# Patient Record
Sex: Female | Born: 1960 | Race: Black or African American | Hispanic: No | Marital: Married
Health system: Southern US, Community
[De-identification: ages and names within clinical notes are randomized; demographics above are authoritative.]

## PROBLEM LIST (undated history)

## (undated) DIAGNOSIS — I10 Essential (primary) hypertension: Secondary | ICD-10-CM

---

## 2019-06-18 ENCOUNTER — Emergency Department
Admission: EM | Admit: 2019-06-18 | Discharge: 2019-06-18 | Disposition: A | Discharge status: Home / Self Care | Payer: BLUE CROSS/BLUE SHIELD | Attending: Emergency Medicine | Admitting: Emergency Medicine

## 2019-06-18 DIAGNOSIS — R4182 Altered mental status, unspecified: Secondary | ICD-10-CM | POA: Insufficient documentation

## 2019-06-18 DIAGNOSIS — I10 Essential (primary) hypertension: Secondary | ICD-10-CM | POA: Insufficient documentation

## 2019-06-18 DIAGNOSIS — M549 Dorsalgia, unspecified: Secondary | ICD-10-CM | POA: Insufficient documentation

## 2019-06-18 DIAGNOSIS — T407X1A Poisoning by cannabis (derivatives), accidental (unintentional), initial encounter: Secondary | ICD-10-CM | POA: Insufficient documentation

## 2019-06-18 DIAGNOSIS — G8929 Other chronic pain: Secondary | ICD-10-CM | POA: Insufficient documentation

## 2019-06-18 DIAGNOSIS — F19920 Other psychoactive substance use, unspecified with intoxication, uncomplicated: Secondary | ICD-10-CM

## 2019-06-18 MED ORDER — LACTATED RINGERS IV SOLN
Freq: Once | INTRAVENOUS | Status: AC
Start: 2019-06-18 — End: 2019-06-18
  Administered 2019-06-18: 17:00:00 via INTRAVENOUS

## 2019-06-18 NOTE — ED Notes (Signed)
Assumed care of pt, received pt somewhat drowsy, vitals updated and noted.  Side rails up x 2 for pt. Safety.

## 2019-06-18 NOTE — ED Notes (Signed)
Bed: T02  Expected date:   Expected time:   Means of arrival:   Comments:  Medic aloc

## 2019-06-18 NOTE — ED Provider Notes (Signed)
Emergency Department Note  [facility] electronic medical record reviewed for pertinent medical history.     Patient: Hannah Navarro, MRN [id number], DOB 12-Feb-1961  The Date of Service for the Emergency Room encounter is 06/18/2019  4:37 PM     Nursing Triage Note :   Chief Complaint   Patient presents with   . Overdose- Accidental     pt  drank a THC drink and consumed a large amount of a THC Gummy; ST 104; BP 170/98 Temp 98.9 RR 16; sugar 114; [place]; 22 gauge in left hand; 4 mg zofran adminsitered       HPI :   Hannah Navarro is a 58 year old female with PMHx as below notable for HTN and chronic back pain who presents with AMS.    The history is limited, as the patient is unable to fully provide a history due to AMS. Per medics, Patient is visiting from out of town and endorses drinking EtOH and eating THC edible gummy prior to ED arrival. Her friend called 911 due to her AMS, she was found to be diaphoretic and nauseous in the field. Received zofran, 2 episodes of nonbloody emesis. Endorses taking hydrocodone today for her back pain but denies ingestion of other substances or IVDU.    No evidence of acute trauma at the scene and patient without any external signs of head/neck/facial trauma.     ROS: Limited due to AMS, patient denies F/ cough/ SOB/ CP/ HA/ lightheadedness/ N/ V/ D/ abdominal pain.     Denies neck pain or stiffness/ photophobia/ blurry vision/ double vision/ pain with eye movement/ weakness/ numbness/ tingling/ balance or walking issues/ dysuria/ hematuria/ CP/ palpitations/ LE swelling/     Past Medical History : HTN    Past Surgical history : No past surgical history on file.    Family History: No family history on file.    Social History:  Social History     Tobacco Use   . Smoking status: Not on file   Substance Use Topics   . Alcohol use: Not on file   . Drug use: Not on file     Housed    Medications:   None       Allergies: Patient has no known  allergies.    Review of Systems:   Complete review of 12 systems reviewed and negative unless otherwise noted in the HPI or above. This was done per my custom and practice for systems appropriate to the chief complaint in an emergency department setting and varies depending on the quality of history that the patient is able to provide. History reviewed today as available from records and EPIC chart.      Physical Exam:   06/18/19  1639   BP: 133/57   Pulse: 53   Resp: 18   Temp: 98.7 F (37.1 C)   SpO2: 94%       Nursing note and vitals reviewed. Pt with no hypoxia.  Gen: Patient is in NAD, non-toxic appearing,  Drowsy but arousable. A/Ox3  HEENT: NC/AT, PERRL, EOMI, MMM, no conjunctival injection, b/l sclera anicteric, oropharynx clear, no LAD, no cephalohematomas, no facial deformities or signs of trauma  Neck: Supple. No midline tenderness. No meningeal signs.  Cardiovascular: RRR no m/r/g, normal heart sounds, no JVD, no carotid bruits  Pulmonary/Chest: CTAB, no increased WOB, no respiratory distress, no wheezes/rhonchi/rales  Abdominal: Soft. NT/ND, +BS, no r/g, no masses   Extr/MSK: Well  perfused, distal pulses intact. No edema. No tenderness. FROM.  Back: No CVAT   Neuro: Limited due to patient cooperation, no facial droop, speech slurred. Sensation intact to light touch in b/l upper/lower extremity   Psychiatric: Normal affect. Mood not labile nor depressed.   Skin: No rashes, lesions, or wounds.     ED Course & Clinical Decision Making:  58 year old  female with PMHx as listed in HPI presents with AMS.       DDx broad including accidental intoxication/overdose; The patient's prehospital fingerstick glucose level was 114, making hypoglycemia an unlikely etiology of this alteration in mental status.  History and physical with low clinical suspicion to strongly suggest life threatening or emergent etiology of AMS, such as ongoing hypotension, hypoxia, respiratory failure, acute CVA, severe electrolyte  abnormality, acute renal failure/uremia, hypertensive or hepatic encephalopathy, stroke, meningitis, encephalitis.    Initial workup broad at this point to include basic labs, EKG, CXR, and UA. Systemic disseminated bacterial infection leading to sepsis is lower on differential, therefore will not activate code sepsis until initial labs return as still unclear if infectious etiology for AMS. Patient placed on cardiac monitoring and pulse oximetry. As cannot r/o acute drug intoxication, will closely monitor as she metabolizes, and re-evaluate for any obvious signs of additional pathology once mentation improves for more reliable history.      Inconsistent with a CNS issue as neuro exam nonfocal, no central vertigo, no signs such as diplopia or dysarthria, and no signs of cranial nerve involvement. No significant external signs of head trauma, therefore will defer advanced neuroimaging at this time, however will consider after initial labs and imaging. Will also provide patient with IVF, high dose PO thiamine and folic acid MVI.    Will continue to reassess patient while in the ER and adjust workup as needed. Symptomatic control as below. Disposition pending workup.     No orders of the defined types were placed in this encounter.    Medications   lactated ringers 1,000 mL IV bolus ( IntraVENOUS New Bag 06/18/19 1710)         Workup Review as of Jun 17 2213   Hannah FinlaySpann, Hannah Navarro Documentation   Fri Jun 18, 2019   1826 Patient re-evaluated, is less drowsy and more alert. Patient observed talking on her cell phone. Will consider ambulation trial at this time          ED Course    Patient is able to tolerate PO intake and ambulate independently    I reevaluated the patient. The patient was discharged home, status improved, with instructions regarding supportive care, medications, and appropriate follow up. I discussed indications for seeking immediate medical attention, and the patient voiced understanding. The  patient was discharged home in stable condition after all questions were answered.       I have discussed my thought process and plan for the patient with the attending physician, Daphene JaegerSloane, Christian M, MD.    Hannah Finlayachel Britton Perkinson, MD  Emergency Medicine Resident, PGY1  [facility]       Llewellyn Schoenberger, Charmaine Downsachel Tiptanya, MD  Resident  06/18/19 2215       Daphene JaegerSloane, Christian M, MD  06/18/19 [phone]

## 2019-06-18 NOTE — EMS Narrative (Addendum)
Pt Age: 58 Years; Gender: Female;  Primary Impression: Behavioral/Psychiatric Crisis;  Medical History: Hypertension (HTN); Pt Medication: ; Pt Allergies: ;    CC:  Date/Time of Symptom Onset: 2020-09-18T15:37:54.000-07:00    HPI:  Provider's Primary Impression: Mental disorder, not otherwise specified  Initial Patient Acuity: Lower Acuity Nyoka Cowden)    Alert:  Patient Care Report Number: [id number]  Incident Number: [id number]  EMS Vehicle (Unit) Number: [place]  EMS Unit Call Sign: M3  Level of Care of This Unit: ALS-Paramedic  Incident Street Address: [place]: [id number]  Incident ZIP Code: [phone]    Assessment:    Procedure - Arrest:  Cardiac Arrest: No    Procedure - Exam:    Procedure - Injury:    Procedure - Airway:    Procedure - Medications:    Procedure - Generic:    Demographics History:  Alcohol/Drug Use Indicators: Smell of Alcohol on Breath    Demographics Practitioner:    Demographics Patient:  Last Name: Worthey  First Name: [place]: [id number]  Patient's Home State: [place] of Residence: [place]  Gender: Female  Age: 61  Age Units: Years    Demographics Times:  Unit Notified by Dispatch Date/Time: 2020-09-18T15:26:59.000-07:00  Unit En Route Date/Time: 2020-09-18T15:27:45.000-07:00  Unit Arrived on Scene Date/Time: 2020-09-18T15:33:13.000-07:00  Arrived at Patient Date/Time: 2020-09-18T15:37:59.000-07:00  Unit Left Scene Date/Time: 2020-09-18T15:50:01.000-07:00  Patient Arrived at Destination Date/Time: 2020-09-18T16:00:02.000-07:00    Demographics Payment:

## 2019-06-18 NOTE — Discharge Instructions (Signed)
Please return to the Emergency Department if you experience chest pain, shortness of breath, fever, chills, or you have any other concerns.

## 2019-06-18 NOTE — ED Notes (Signed)
PIV removed, catheter tip intact. VSS. AVS provided to pt.  Pt denied further questions/needs/concerns. Ambulated with steady gait. Husband driving pt home.

## 2019-06-18 NOTE — ED Notes (Signed)
Pt tolerated PO juice & ambulated to restroom with steady gait. Pt stated she feels safe to go home with husband

## 2019-06-18 NOTE — ED Notes (Signed)
breathalizer with mod effort 0.000

## 2019-12-30 ENCOUNTER — Encounter (INDEPENDENT_AMBULATORY_CARE_PROVIDER_SITE_OTHER): Payer: Self-pay

## 2021-03-04 ENCOUNTER — Encounter (HOSPITAL_COMMUNITY): Payer: Self-pay

## 2021-03-04 ENCOUNTER — Other Ambulatory Visit: Payer: Self-pay

## 2021-03-04 ENCOUNTER — Emergency Department (HOSPITAL_COMMUNITY): Payer: Self-pay

## 2021-03-04 ENCOUNTER — Emergency Department (HOSPITAL_COMMUNITY)
Admission: EM | Admit: 2021-03-04 | Discharge: 2021-03-04 | Disposition: A | Discharge status: Home / Self Care | Payer: Self-pay | Attending: Emergency Medicine | Admitting: Emergency Medicine

## 2021-03-04 DIAGNOSIS — R079 Chest pain, unspecified: Secondary | ICD-10-CM | POA: Insufficient documentation

## 2021-03-04 DIAGNOSIS — I10 Essential (primary) hypertension: Secondary | ICD-10-CM | POA: Insufficient documentation

## 2021-03-04 DIAGNOSIS — R1013 Epigastric pain: Secondary | ICD-10-CM | POA: Insufficient documentation

## 2021-03-04 HISTORY — DX: Essential (primary) hypertension: I10

## 2021-03-04 LAB — BASIC METABOLIC PANEL
Anion gap: 9 (ref 5–15)
BUN: 12 mg/dL (ref 6–20)
CO2: 27 mmol/L (ref 22–32)
Calcium: 9.1 mg/dL (ref 8.9–10.3)
Chloride: 104 mmol/L (ref 98–111)
Creatinine, Ser: 0.81 mg/dL (ref 0.44–1.00)
GFR, Estimated: >60 mL/min (ref >60)
Glucose, Bld: 113 mg/dL — ABNORMAL HIGH (ref 70–99)
Potassium: 3.4 mmol/L — ABNORMAL LOW (ref 3.5–5.1)
Sodium: 140 mmol/L (ref 135–145)

## 2021-03-04 LAB — CBC
HCT: 31.4 % — ABNORMAL LOW (ref 36.0–46.0)
Hemoglobin: 9.1 g/dL — ABNORMAL LOW (ref 12.0–15.0)
MCH: 22.5 pg — ABNORMAL LOW (ref 26.0–34.0)
MCHC: 29 g/dL — ABNORMAL LOW (ref 30.0–36.0)
MCV: 77.7 fL — ABNORMAL LOW (ref 80.0–100.0)
Platelets: 306 10*3/uL (ref 150–400)
RBC: 4.04 MIL/uL (ref 3.87–5.11)
RDW: 17.9 % — ABNORMAL HIGH (ref 11.5–15.5)
WBC: 7.1 10*3/uL (ref 4.0–10.5)
nRBC: 0 % (ref 0.0–0.2)

## 2021-03-04 LAB — HEPATIC FUNCTION PANEL
ALT: 19 U/L (ref 0–44)
AST: 19 U/L (ref 15–41)
Albumin: 3.5 g/dL (ref 3.5–5.0)
Alkaline Phosphatase: 79 U/L (ref 38–126)
Bilirubin, Direct: <0.1 mg/dL (ref 0.0–0.2)
Total Bilirubin: 0.3 mg/dL (ref 0.3–1.2)
Total Protein: 6.8 g/dL (ref 6.5–8.1)

## 2021-03-04 LAB — I-STAT BETA HCG BLOOD, ED (MC, WL, AP ONLY): I-stat hCG, quantitative: <5 m[IU]/mL (ref <5)

## 2021-03-04 LAB — TROPONIN I (HIGH SENSITIVITY)
Troponin I (High Sensitivity): 8 ng/L (ref <18)
Troponin I (High Sensitivity): 7 ng/L (ref <18)

## 2021-03-04 LAB — LIPASE, BLOOD: Lipase: 35 U/L (ref 11–51)

## 2021-03-04 MED ORDER — PANTOPRAZOLE SODIUM 40 MG PO TBEC: 40.0000 mg | DELAYED_RELEASE_TABLET | Freq: Every day | ORAL | 0 refills | Status: AC

## 2021-03-04 MED ORDER — MORPHINE SULFATE (PF) 4 MG/ML IV SOLN
4.0000 mg | Freq: Once | INTRAVENOUS | Status: AC
  Administered 2021-03-04: 4 mg via INTRAVENOUS
  Filled 2021-03-04: qty 1

## 2021-03-04 MED ORDER — ONDANSETRON HCL 4 MG/2ML IJ SOLN
4.0000 mg | Freq: Once | INTRAMUSCULAR | Status: AC
  Administered 2021-03-04: 4 mg via INTRAVENOUS
  Filled 2021-03-04: qty 2

## 2021-03-04 MED ORDER — SODIUM CHLORIDE 0.9 % IV BOLUS
1000.0000 mL | Freq: Once | INTRAVENOUS | Status: AC
  Administered 2021-03-04: 1000 mL via INTRAVENOUS

## 2021-03-04 NOTE — ED Notes (Signed)
Receiving abdominal ultrasound in ED 21

## 2021-03-04 NOTE — ED Notes (Signed)
Iv team at the bedside 

## 2021-03-04 NOTE — ED Triage Notes (Signed)
Patient complains of intermittent chest pain since Friday. Right anterior chest pain and pain worse with movement and inspiration. Denies any respiratory illness. Patient states tender to touch

## 2021-03-04 NOTE — ED Provider Notes (Signed)
[facility] EMERGENCY DEPARTMENT Provider Note   CSN: [id number] Arrival date & time: 03/04/21  1024     History No chief complaint on file.   Diana Lynch is a 60 y.o. female.  60 year old female with past medical history of hypertension presents with complaint of epigastric pain described as a heaviness, intermittent for the past 3 weeks, worse for the past week.  Pain is worse with deep inspiration or with palpation.  Denies associated nausea, vomiting, shortness of breath, diaphoresis.  Denies changes in bowel or bladder habits.  Patient was seen on May 16 at an ER in [place] for same, scheduled for outpatient stress test and told she was anemic, has been taking iron supplement.        Past Medical History:  Diagnosis Date  . Hypertension     There are no problems to display for this patient.   History reviewed. No pertinent surgical history.   OB History   No obstetric history on file.     No family history on file.     Home Medications Prior to Admission medications   Not on File    Allergies    Sulfa antibiotics and Tramadol  Review of Systems   Review of Systems  Constitutional: Negative for chills and fever.  Respiratory: Negative for shortness of breath.   Cardiovascular: Positive for chest pain.  Gastrointestinal: Positive for abdominal pain. Negative for constipation, diarrhea, nausea and vomiting.  Musculoskeletal: Negative for back pain.  Skin: Negative for rash and wound.  Allergic/Immunologic: Negative for immunocompromised state.  Neurological: Negative for weakness.  Psychiatric/Behavioral: Negative for confusion.  All other systems reviewed and are negative.   Physical Exam Updated Vital Signs BP (!) 168/84   Pulse 70   Temp 98.9 F (37.2 C) (Oral)   Resp 20   SpO2 100%   Physical Exam Vitals and nursing note reviewed.  Constitutional:      General: She is not in acute distress.    Appearance: She is  well-developed. She is not diaphoretic.  HENT:     Head: Normocephalic and atraumatic.  Eyes:     Conjunctiva/sclera: Conjunctivae normal.  Cardiovascular:     Rate and Rhythm: Normal rate and regular rhythm.     Pulses: Normal pulses.     Heart sounds: Normal heart sounds.  Pulmonary:     Effort: Pulmonary effort is normal.     Breath sounds: Normal breath sounds.  Abdominal:     Palpations: Abdomen is soft.     Tenderness: There is abdominal tenderness in the right upper quadrant and epigastric area.  Musculoskeletal:     Cervical back: Neck supple.  Skin:    General: Skin is warm and dry.     Findings: No erythema or rash.  Neurological:     Mental Status: She is alert and oriented to person, place, and time.  Psychiatric:        Behavior: Behavior normal.     ED Results / Procedures / Treatments   Labs (all labs ordered are listed, but only abnormal results are displayed) Labs Reviewed  BASIC METABOLIC PANEL - Abnormal; Notable for the following components:      Result Value   Potassium 3.4 (*)    Glucose, Bld 113 (*)    All other components within normal limits  CBC - Abnormal; Notable for the following components:   Hemoglobin 9.1 (*)    HCT 31.4 (*)    MCV 77.7 (*)  MCH 22.5 (*)    MCHC 29.0 (*)    RDW 17.9 (*)    All other components within normal limits  HEPATIC FUNCTION PANEL  LIPASE, BLOOD  I-STAT BETA HCG BLOOD, ED ([facility], [facility], [facility] ONLY)  TROPONIN I (HIGH SENSITIVITY)  TROPONIN I (HIGH SENSITIVITY)    EKG None  Radiology DG Chest 2 View  Result Date: 03/04/2021 CLINICAL DATA:  Right-sided chest pain. EXAM: CHEST - 2 VIEW COMPARISON:  None. FINDINGS: The heart size and mediastinal contours are within normal limits. Both lungs are clear. The visualized skeletal structures are unremarkable. IMPRESSION: No active cardiopulmonary disease. Electronically Signed   By: Zerita Boers M.D.   On: 03/04/2021 11:10   US Abdomen Limited  Result Date:  03/04/2021 CLINICAL DATA:  Three weeks of right upper quadrant pain EXAM: ULTRASOUND ABDOMEN LIMITED RIGHT UPPER QUADRANT COMPARISON:  None. FINDINGS: Gallbladder: No gallstones or wall thickening visualized. No sonographic Angeli Demilio sign noted by sonographer. Common bile duct: Diameter: 6 mm Liver: No focal lesion identified. Within normal limits in parenchymal echogenicity. Portal vein is patent on color Doppler imaging with normal direction of blood flow towards the liver. Other: Multiple right renal cysts measuring up to 2.6 cm. IMPRESSION: Unremarkable ultrasound of the right upper quadrant. Electronically Signed   By: Dahlia Bailiff MD   On: 03/04/2021 14:32    Procedures Procedures   Medications Ordered in ED Medications  ondansetron [facility]) injection 4 mg (has no administration in time range)  morphine 4 MG/ML injection 4 mg (has no administration in time range)  sodium chloride 0.9 % bolus 1,000 mL (has no administration in time range)    ED Course  I have reviewed the triage vital signs and the nursing notes.  Pertinent labs & imaging results that were available during my care of the patient were reviewed by me and considered in my medical decision making (see chart for details).  Clinical Course as of 03/04/21 1516  Sun Mar 05, 2563  5724 60 year old female with complaint of epigastric/lower chest pain for the past 3 weeks, progressively worsening.  Found to have epigastric and right upper quadrant tenderness on exam. Cardiac work-up in the ED in [place] on May 16 without significant findings and referred for stress test which is scheduled at the end of this month however patient is scheduled to travel to [place] before this test can be completed and she feels that she is not well enough to travel at this point.  Ultrasound is negative for gallbladder disease.  CBC with hemoglobin of 9.1, previously upper eights on her last ED work-up, is taking supplemental iron and has been  referred for follow-up, no significant change from prior to suggest active GI bleed. BMP without significant findings, hepatic function pending given complaint of right upper quadrant pain as well as pending lipase.  Initial troponin is 8, repeat pending.  Care signed out pending hepatic function panel and lipase, repeat troponin. [LM]  1513 Atypical CP (eigastric/RUQ) x 3 weeks Has had ACS w/u at other hospital recently. Was told she would need a stress test but not scheduled until a few weeks.  RUQ [place] negative.  Follow up hepatic fxn panel, lipase, repeat trop (although delta compared to recent ED).  [ZB]    Clinical Course User Index [LM] Tacy Learn, PA-C [ZB] Pearson Grippe, DO   MDM Rules/Calculators/A&P  Final Clinical Impression(s) / ED Diagnoses Final diagnoses:  Epigastric pain    Rx / DC Orders ED Discharge Orders    None       Tacy Learn, PA-C 03/04/21 1516    Varney Biles, MD 03/04/21 [phone]

## 2021-03-04 NOTE — Discharge Instructions (Signed)
Based on the blood work that we did today, you are not having a heart attack.  The only way to know if you have heart disease is with a stress test just like was recommended by the emergency department in [place]. We also do testing of your pancreas, gallbladder and that was all normal.  It is possible that you have gastritis also known as GERD/esophageal reflux.  Recommend taking the Protonix that was previously prescribed to see if you have any relief. Please attend the appointment for your previously scheduled stress test.

## 2021-03-04 NOTE — ED Notes (Signed)
IV attempt to RAC not successful

## 2021-03-04 NOTE — ED Provider Notes (Signed)
[facility] Physical Exam  BP (!) 168/92   Pulse 70   Temp 98.9 F (37.2 C) (Oral)   Resp 14   SpO2 100%     ED Course/Procedures   Clinical Course as of 03/04/21 1638  Sun Mar 04, 5968  4023 60 year old female with complaint of epigastric/lower chest pain for the past 3 weeks, progressively worsening.  Found to have epigastric and right upper quadrant tenderness on exam. Cardiac work-up in the ED in [place] on May 16 without significant findings and referred for stress test which is scheduled at the end of this month however patient is scheduled to travel to [place] before this test can be completed and she feels that she is not well enough to travel at this point.  Ultrasound is negative for gallbladder disease.  CBC with hemoglobin of 9.1, previously upper eights on her last ED work-up, is taking supplemental iron and has been referred for follow-up, no significant change from prior to suggest active GI bleed. BMP without significant findings, hepatic function pending given complaint of right upper quadrant pain as well as pending lipase.  Initial troponin is 8, repeat pending.  Care signed out pending hepatic function panel and lipase, repeat troponin. [LM]  1513 Atypical CP (eigastric/RUQ) x 3 weeks Has had ACS w/u at other hospital recently. Was told she would need a stress test but not scheduled until a few weeks.  RUQ [place] negative.  Follow up hepatic fxn panel, lipase, repeat trop (although delta compared to recent ED).  [ZB]    Clinical Course User Index [LM] Tacy Learn, PA-C [ZB] Pearson Grippe, DO    Procedures  MDM    Agree with previous team assessment and plan. Delta troponin negative. Low risk via heart pathway.  She has been previously scheduled for a stress test and will follow up with that as scheduled. Likely suffering from GERD.  We will start her back on previously prescribed Protonix. Discharged in good condition.       Pearson Grippe, DO 03/04/21 1639    Quintella Reichert, MD 03/04/21 [phone]

## 2021-07-04 ENCOUNTER — Encounter: Payer: Self-pay | Admitting: *Deleted

## 2021-07-06 ENCOUNTER — Inpatient Hospital Stay: Payer: BC Managed Care – PPO | Attending: Oncology | Admitting: Oncology

## 2021-07-06 ENCOUNTER — Other Ambulatory Visit: Payer: Self-pay

## 2021-07-06 ENCOUNTER — Encounter: Payer: Self-pay | Admitting: *Deleted

## 2021-07-06 VITALS — BP 174/99 | HR 83 | Temp 98.7°F | Resp 18 | Ht 69.0 in | Wt 250.0 lb

## 2021-07-06 DIAGNOSIS — C50111 Malignant neoplasm of central portion of right female breast: Secondary | ICD-10-CM | POA: Diagnosis not present

## 2021-07-06 DIAGNOSIS — C50919 Malignant neoplasm of unspecified site of unspecified female breast: Secondary | ICD-10-CM | POA: Insufficient documentation

## 2021-07-06 DIAGNOSIS — Z170 Estrogen receptor positive status [ER+]: Secondary | ICD-10-CM | POA: Diagnosis not present

## 2021-07-06 NOTE — Progress Notes (Signed)
PATIENT NAVIGATOR PROGRESS NOTE  Name: Diana Lynch Date: 07/06/2021 MRN: [id number]  DOB: 16-Mar-1961   Reason for visit:  Initial visit with Dr Benay Spice  Comments:  Met with patient and her husband Marcue during initial visit with Dr Benay Spice, she is having Bilateral mastectomies on 10/19, Dr Benay Spice will see her in clinic on 10/28 to discuss post surgical pathology and treatment plan. Answered questions and gave her contact information to call with any questions or issue.     Time spent counseling/coordinating care: 30-45 minutes

## 2021-07-06 NOTE — Progress Notes (Signed)
Diana Lynch Consult   Requesting MD: Earlean Polka, Md [address] [place],  [place] [id number]   Diana Lynch 60 y.o.  09-17-1961    Reason for Consult: Breast cancer   HPI: [name] reports seeing her gynecologist for a routine examination.  She was noted to have a mass at the 12 o'clock position of the right breast.  She was referred for a bilateral tomosynthesis mammogram on 05/23/2021.  A new irregular area of architectural distortion with pleomorphic calcifications were noted in the right breast at the 12 o'clock position.  No other masses in either breast.  Comparison was made to a mammogram from 05/02/2020. A right diagnostic mammogram on 05/31/2021 revealed a 2.6 x 3.8 x 3.1 cm irregular density with a spiculated margin at the 12 o'clock position middle depth in the right breast.  No other masses.   A right ultrasound on 05/31/2021 revealed a 1.6 x 2.1 x 2.2 cm mass with irregular margins at the 12 o'clock position of the right breast.  The right axilla appeared "clear ". She was referred for an ultrasound-guided biopsy on 06/14/2021. The pathology revealed invasive ductal carcinoma with the greatest dimension being 12 mm in the core biopsy.  The tumor is grade 2 with a minor component of DCIS.  The tumor is HER2 negative by FISH, ER positive (2+, 60%), PR low positive (2+, 2%).  She was referred to Dr. West Carbo to discuss surgical options.  She has elected to proceed with bilateral mastectomy.  She was referred for genetic counseling and no pathogenic variant was identified.  She is scheduled for bilateral mastectomy and a sentinel lymph node biopsy on 07/18/2021.  She was referred for a plain x-ray of the left hip and pelvis on 06/28/2021.  Mild bilateral hip degenerative changes were noted.  Past Medical History:  Diagnosis Date   Hypertension     .  G2, P2   .  Asthma   .  Chronic back pain   .  History of anemia secondary to  menorrhagia   .  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  Eczema  Past surgical history: Hysterectomy Lumbar disc surgery  Medications: Reviewed  Allergies:  Allergies  Allergen Reactions   Sulfa Antibiotics Rash   Tramadol Itching    Family history: 2 maternal aunts died of breast cancer (age 78 and 74s), 2 maternal aunts died of uterine cancer, 2 maternal cousins had breast cancer, 1 died at an age of less than 67, a maternal uncle had prostate cancer.  A maternal uncle had "cancer ".  Social History:   She lives with her husband in [place].  She works from home for [employer].  She smokes cigarettes when she is "stressed ".  She reports social alcohol use.  No transfusion history.  She has received COVID-19 vaccines  ROS:   Positives include: Nausea for the past year treated with Phenergan, "stress "headaches, pruritus at the neck and arms related to eczema, chronic low back pain, discomfort in the right breast, left lower back pain-worse with weightbearing for the past few months  A complete ROS was otherwise negative.  Physical Exam:  Blood pressure (!) 174/99, pulse 83, temperature 98.7 F (37.1 C), temperature source Oral, resp. rate 18, height '5\' 9"'  (1.753 m), weight 250 lb (113.4 kg), SpO2 100 %.  HEENT: Neck without mass Lungs: Clear bilaterally Cardiac: Regular rate and rhythm Abdomen: No hepatosplenomegaly, no mass, nontender  Vascular: No leg edema Lymph nodes: No cervical, supraclavicular, axillary, or inguinal nodes Neurologic: Alert and oriented, motor exam appears intact in the upper and lower extremities bilaterally Skin: Dry flaking rash at the dorsal lower arms and to a lesser extent at the neck, scars at the low abdominal pannus and bilateral axillae Musculoskeletal: Tender at the posterior iliac bilaterally, no mass Breast: At the upper right breast 12 o'clock position there is an approximate 2 cm mass, the right axilla appears benign.  Left breast  without mass   LAB:  CBC  Lab Results  Component Value Date   WBC 7.1 03/04/2021   HGB 9.1 (L) 03/04/2021   HCT 31.4 (L) 03/04/2021   MCV 77.7 (L) 03/04/2021   PLT 306 03/04/2021        CMP  Lab Results  Component Value Date   NA 140 03/04/2021   K 3.4 (L) 03/04/2021   CL 104 03/04/2021   CO2 27 03/04/2021   GLUCOSE 113 (H) 03/04/2021   BUN 12 03/04/2021   CREATININE 0.81 03/04/2021   CALCIUM 9.1 03/04/2021   PROT 6.8 03/04/2021   ALBUMIN 3.5 03/04/2021   AST 19 03/04/2021   ALT 19 03/04/2021   ALKPHOS 79 03/04/2021   BILITOT 0.3 03/04/2021   GFRNONAA >60 03/04/2021      Imaging: As per HPI, pelvis and left hip x-ray 06/28/2021-no fracture, mild bilateral hip degenerative changes   Assessment/Plan:   Right breast cancer Right breast mass at 12:00 palpated on physical exam by gynecology Tomosynthesis mammogram 05/23/2021-new irregular distortion with pleomorphic calcifications in the right breast at 12:00 anterior depth Diagnostic right mammogram 05/31/2021-2.6 x 3.8 x 3.1 cm irregular density with spiculated margin in the right breast at 12:00 middle depth Right breast ultrasound 05/31/2021 -1.6 x 2.1 x 2.2 cm mass with irregular margins at 12:00 in the right breast, right axilla is "clear " Ultrasound-guided biopsy right breast mass 06/14/2021-invasive ductal carcinoma, HER2 negative by FISH, ER positive (2+, 60%), PR positive (2+, 2%)  G2 P2 Hypertension Eczema Hysterectomy secondary to menorrhagia Multiple family members with breast cancer, family history of uterine cancer and prostate cancer History of large cyst in the inferior right kidney on CT 08/16/2020-follow-up CT recommended   Disposition:   [name] has been diagnosed with invasive breast cancer.  She has a clinical node-negative tumor with low-level hormone receptor expression.  I discussed treatment options with Ms. Ricard Dillon.  We discussed lumpectomy/sentinel lymph node biopsy.  We also discussed  neoadjuvant and adjuvant chemotherapy.  She has made a firm decision to proceed with bilateral mastectomy and a sentinel lymph node biopsy.  I will plan to see her after surgery to discuss adjuvant systemic treatment options.  We will request repeat hormone receptor testing on the resected tumor.  I will confer with Dr. West Carbo regarding the tumor board discussion at [facility] and the surgical plan.  She will return for follow-up visit on 07/19/2021 to review the surgical pathology report and make adjuvant treatment recommendations.  Betsy Coder, MD  07/06/2021, 10:45 AM

## 2021-07-23 ENCOUNTER — Telehealth: Payer: Self-pay | Admitting: *Deleted

## 2021-07-24 ENCOUNTER — Encounter: Payer: Self-pay | Admitting: *Deleted

## 2021-07-24 NOTE — Progress Notes (Signed)
PATIENT NAVIGATOR PROGRESS NOTE  Name: Diana Lynch Date: 07/24/2021 MRN: [id number]  DOB: Dec 04, 1960   Reason for visit:  Phone call after surgery  Comments:  Spoke with patient after surgery on 10/19 bilateral mastectomies. She has F/U appt with surgeon this Thursday with hopes of having drains removed at that time. Will call Thursday afternoon to confirm appt here on Friday 10/28    Time spent counseling/coordinating care: < 15 minutes

## 2021-07-26 ENCOUNTER — Encounter: Payer: Self-pay | Admitting: *Deleted

## 2021-07-27 ENCOUNTER — Inpatient Hospital Stay: Payer: BC Managed Care – PPO | Admitting: Oncology

## 2021-08-14 ENCOUNTER — Encounter: Payer: Self-pay | Admitting: *Deleted

## 2021-08-14 ENCOUNTER — Inpatient Hospital Stay: Payer: BC Managed Care – PPO | Attending: Oncology | Admitting: Oncology

## 2022-05-10 ENCOUNTER — Telehealth: Payer: Self-pay | Admitting: *Deleted

## 2022-05-10 NOTE — Telephone Encounter (Signed)
Patient called to request RN email her new patient note from 06/2021 to her. Informed her that we are not able to email records, but she can sign up for mychart and see the note. Provided website for [employer] and phone # for Help desk. Also provided phone # for billing as she was requesting copy of bill for that encounter.

## 2022-12-11 IMAGING — US US ABDOMEN LIMITED
1 series · 14 of 25 positions shown · non-contrast
Comparison: None.

CLINICAL DATA: Three weeks of right upper quadrant pain

EXAM:
ULTRASOUND ABDOMEN LIMITED RIGHT UPPER QUADRANT

[Series 1: us abdomen limited · 14 of 72 slices shown]
[im 1/72]
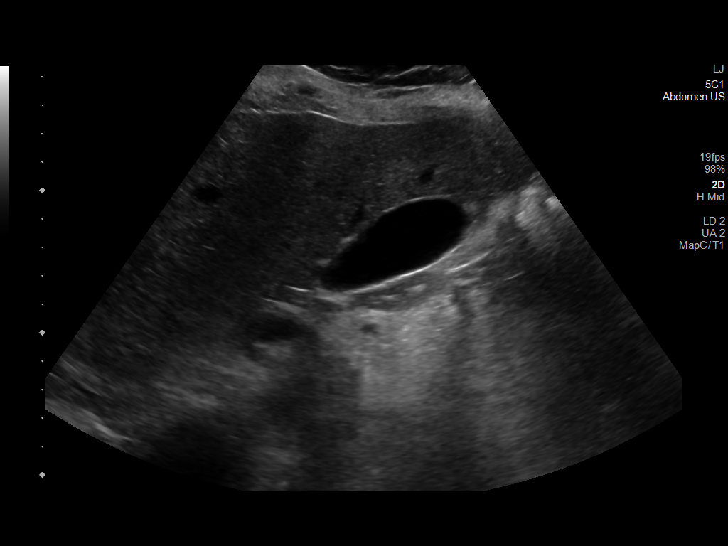
[im 6/72]
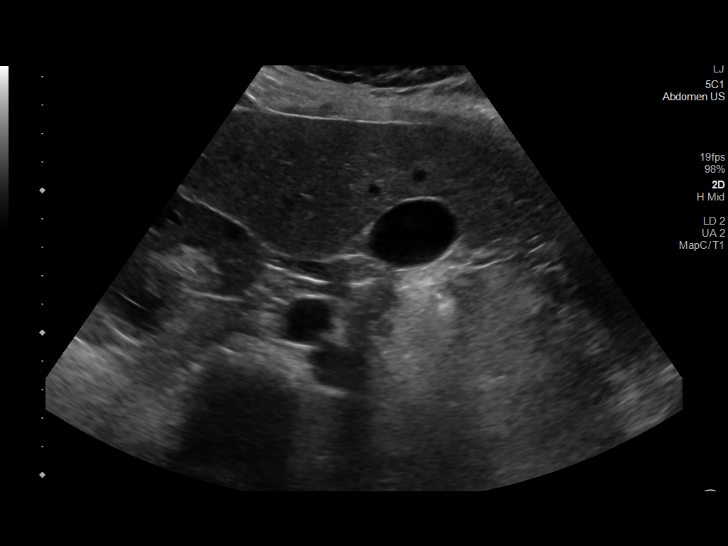
[im 12/72]
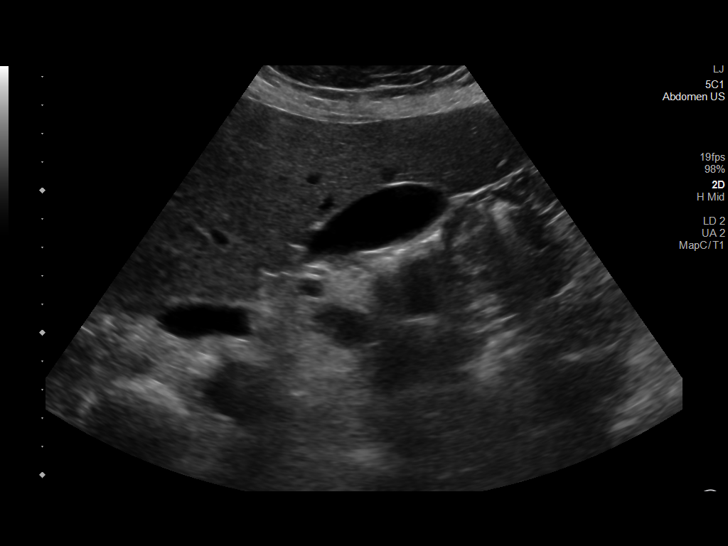
[im 18/72]
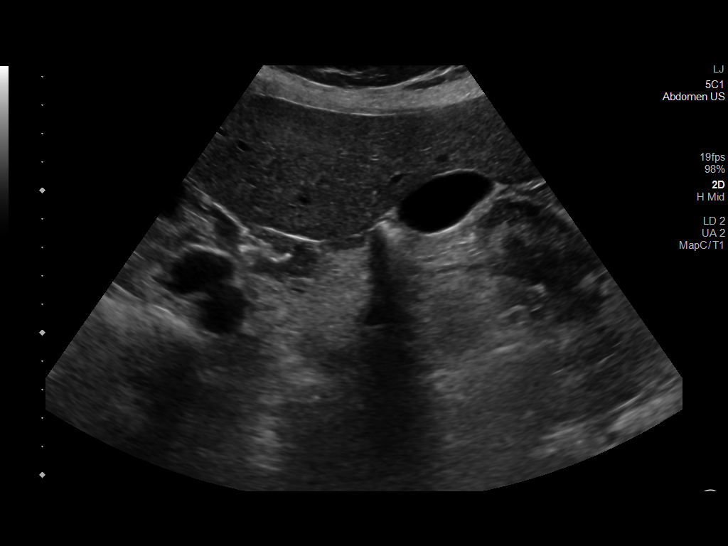
[im 24/72]
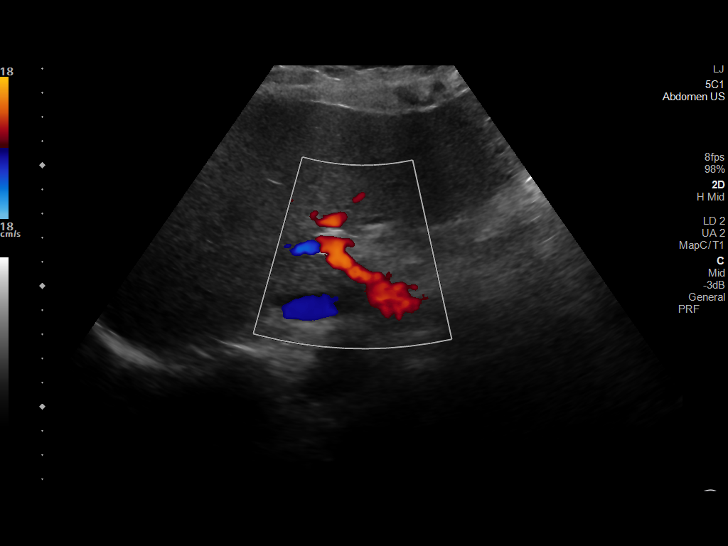
[im 27/72]
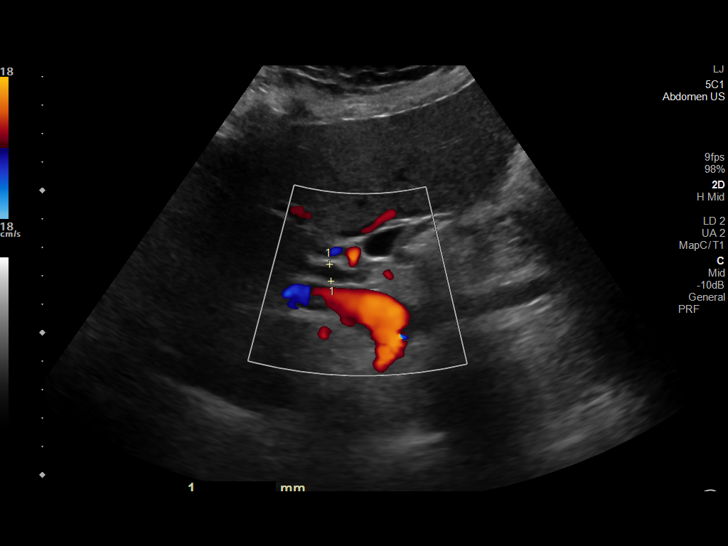
[im 33/72]
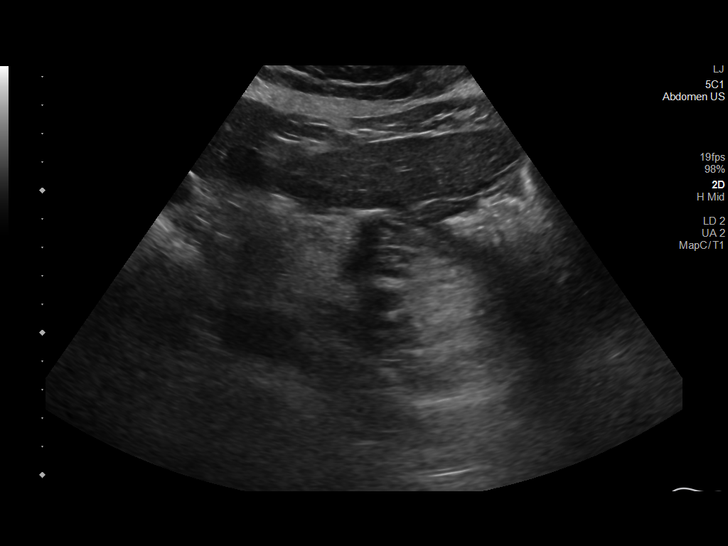
[im 39/72]
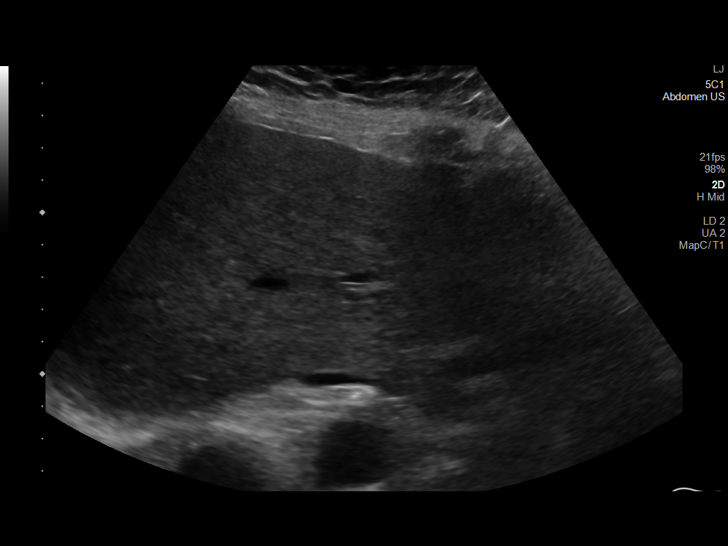
[im 45/72]
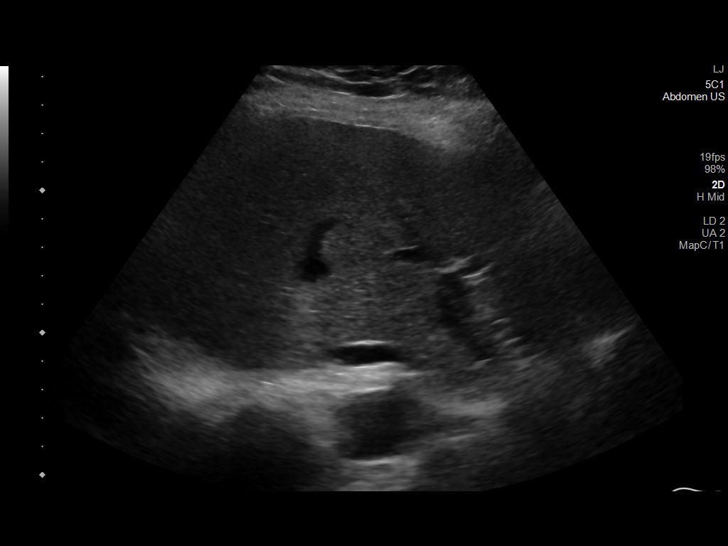
[im 48/72]
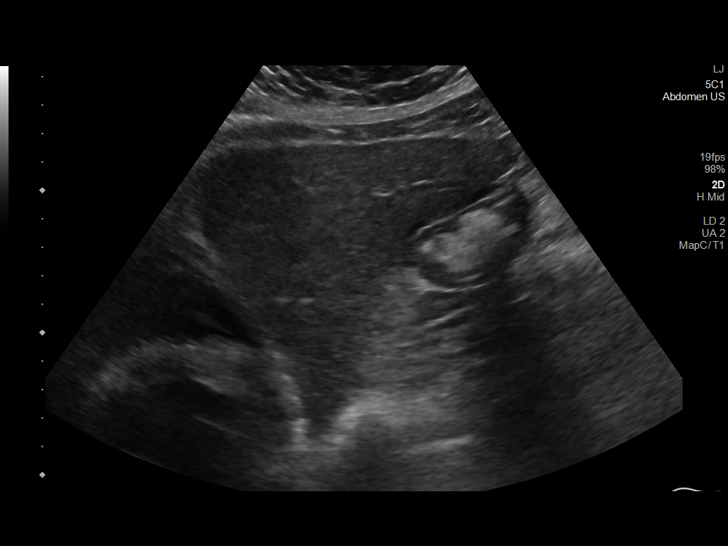
[im 54/72]
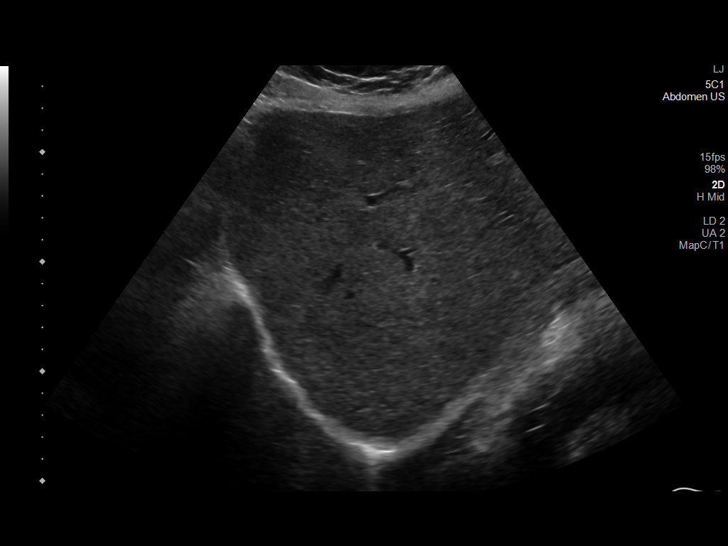
[im 60/72]
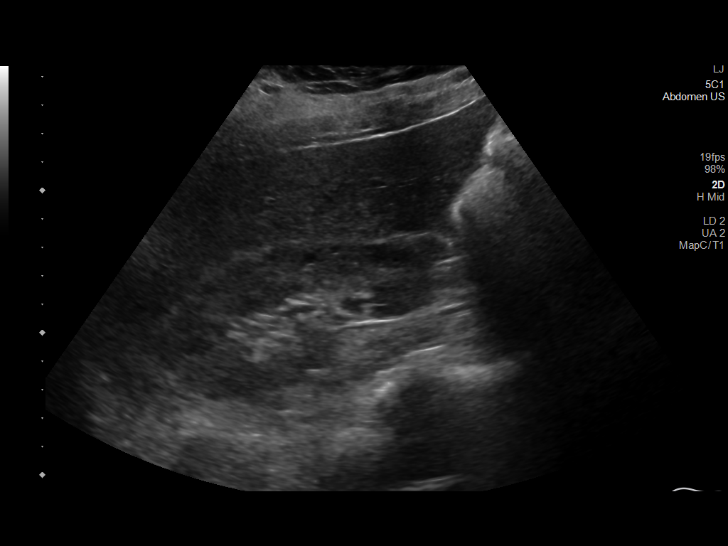
[im 66/72]
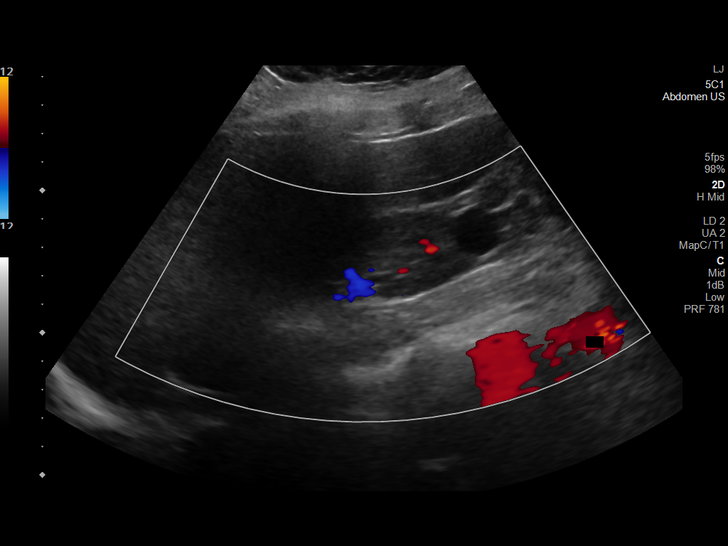
[im 72/72]
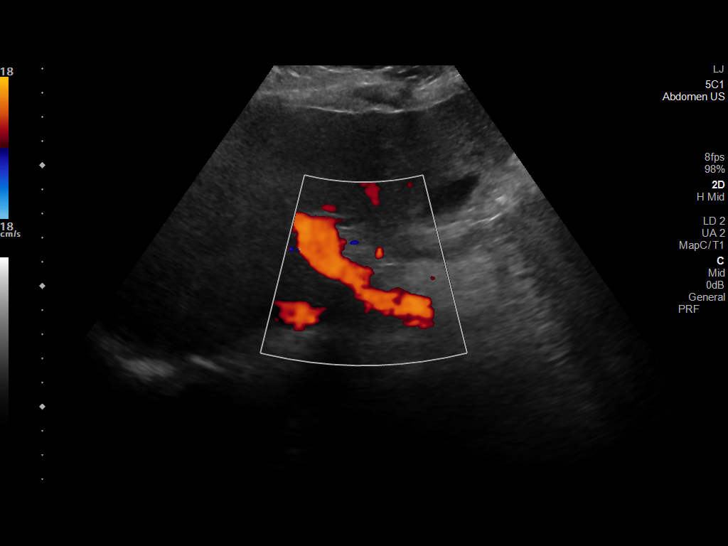

[14 of 25 positions shown; findings below may reference images not displayed]

FINDINGS: Gallbladder:

No gallstones or wall thickening visualized. No sonographic Murphy
sign noted by sonographer.

Common bile duct:

Diameter: 6 mm

Liver:

No focal lesion identified. Within normal limits in parenchymal
echogenicity. Portal vein is patent on color Doppler imaging with
normal direction of blood flow towards the liver.

Other: Multiple right renal cysts measuring up to 2.6 cm.
IMPRESSION: Unremarkable ultrasound of the right upper quadrant.

## 2023-02-11 LAB — EXTERNAL GENERIC LAB PROCEDURE: COLOGUARD: NEGATIVE

## 2023-08-21 NOTE — Telephone Encounter (Signed)
Telephone call
